# Patient Record
Sex: Male | Born: 1980 | Race: White | Hispanic: No | Marital: Married | Smoking: Never smoker
Health system: Southern US, Community
[De-identification: ages and names within clinical notes are randomized; demographics above are authoritative.]

---

## 2018-04-07 ENCOUNTER — Other Ambulatory Visit: Payer: Self-pay

## 2018-04-07 ENCOUNTER — Emergency Department (HOSPITAL_BASED_OUTPATIENT_CLINIC_OR_DEPARTMENT_OTHER)
Admission: EM | Admit: 2018-04-07 | Discharge: 2018-04-07 | Disposition: A | Discharge status: Home / Self Care | Payer: BLUE CROSS/BLUE SHIELD | Attending: Emergency Medicine | Admitting: Emergency Medicine

## 2018-04-07 ENCOUNTER — Emergency Department (HOSPITAL_BASED_OUTPATIENT_CLINIC_OR_DEPARTMENT_OTHER): Payer: BLUE CROSS/BLUE SHIELD

## 2018-04-07 ENCOUNTER — Encounter (HOSPITAL_BASED_OUTPATIENT_CLINIC_OR_DEPARTMENT_OTHER): Payer: Self-pay | Admitting: Adult Health

## 2018-04-07 DIAGNOSIS — R3912 Poor urinary stream: Secondary | ICD-10-CM | POA: Diagnosis not present

## 2018-04-07 DIAGNOSIS — R51 Headache: Secondary | ICD-10-CM | POA: Diagnosis present

## 2018-04-07 DIAGNOSIS — Z79899 Other long term (current) drug therapy: Secondary | ICD-10-CM | POA: Diagnosis not present

## 2018-04-07 DIAGNOSIS — R200 Anesthesia of skin: Secondary | ICD-10-CM | POA: Insufficient documentation

## 2018-04-07 DIAGNOSIS — R519 Headache, unspecified: Secondary | ICD-10-CM

## 2018-04-07 DIAGNOSIS — R42 Dizziness and giddiness: Secondary | ICD-10-CM | POA: Insufficient documentation

## 2018-04-07 DIAGNOSIS — R202 Paresthesia of skin: Secondary | ICD-10-CM | POA: Insufficient documentation

## 2018-04-07 DIAGNOSIS — H538 Other visual disturbances: Secondary | ICD-10-CM | POA: Diagnosis not present

## 2018-04-07 LAB — ETHANOL: Alcohol, Ethyl (B): <10 mg/dL (ref <10)

## 2018-04-07 LAB — URINALYSIS, ROUTINE W REFLEX MICROSCOPIC
BILIRUBIN URINE: NEGATIVE
Glucose, UA: NEGATIVE mg/dL
Hgb urine dipstick: NEGATIVE
Ketones, ur: NEGATIVE mg/dL
LEUKOCYTES UA: NEGATIVE
NITRITE: NEGATIVE
PH: 6 (ref 5.0–8.0)
Protein, ur: NEGATIVE mg/dL
SPECIFIC GRAVITY, URINE: 1.02 (ref 1.005–1.030)

## 2018-04-07 LAB — COMPREHENSIVE METABOLIC PANEL
ALBUMIN: 4.7 g/dL (ref 3.5–5.0)
ALT: 32 U/L (ref 0–44)
AST: 22 U/L (ref 15–41)
Alkaline Phosphatase: 63 U/L (ref 38–126)
Anion gap: 9 (ref 5–15)
BILIRUBIN TOTAL: 0.5 mg/dL (ref 0.3–1.2)
BUN: 17 mg/dL (ref 6–20)
CALCIUM: 9.1 mg/dL (ref 8.9–10.3)
CO2: 26 mmol/L (ref 22–32)
CREATININE: 1.05 mg/dL (ref 0.61–1.24)
Chloride: 103 mmol/L (ref 98–111)
GFR calc Af Amer: >60 mL/min (ref >60)
Glucose, Bld: 96 mg/dL (ref 70–99)
Potassium: 3.9 mmol/L (ref 3.5–5.1)
Sodium: 138 mmol/L (ref 135–145)
TOTAL PROTEIN: 7.3 g/dL (ref 6.5–8.1)

## 2018-04-07 LAB — CBC
HEMATOCRIT: 39.8 % (ref 39.0–52.0)
Hemoglobin: 14.3 g/dL (ref 13.0–17.0)
MCH: 30.8 pg (ref 26.0–34.0)
MCHC: 35.9 g/dL (ref 30.0–36.0)
MCV: 85.6 fL (ref 78.0–100.0)
Platelets: 191 10*3/uL (ref 150–400)
RBC: 4.65 MIL/uL (ref 4.22–5.81)
RDW: 12.4 % (ref 11.5–15.5)
WBC: 7.2 10*3/uL (ref 4.0–10.5)

## 2018-04-07 LAB — RAPID URINE DRUG SCREEN, HOSP PERFORMED
Amphetamines: NOT DETECTED
Barbiturates: NOT DETECTED
Benzodiazepines: NOT DETECTED
Cocaine: NOT DETECTED
OPIATES: NOT DETECTED
Tetrahydrocannabinol: NOT DETECTED

## 2018-04-07 LAB — DIFFERENTIAL
BASOS ABS: 0 10*3/uL (ref 0.0–0.1)
Basophils Relative: 0 %
Eosinophils Absolute: 0.2 10*3/uL (ref 0.0–0.7)
Eosinophils Relative: 3 %
LYMPHS ABS: 3 10*3/uL (ref 0.7–4.0)
LYMPHS PCT: 42 %
MONOS PCT: 8 %
Monocytes Absolute: 0.6 10*3/uL (ref 0.1–1.0)
NEUTROS PCT: 47 %
Neutro Abs: 3.4 10*3/uL (ref 1.7–7.7)

## 2018-04-07 LAB — APTT: aPTT: 28 seconds (ref 24–36)

## 2018-04-07 LAB — CBG MONITORING, ED: Glucose-Capillary: 100 mg/dL — ABNORMAL HIGH (ref 70–99)

## 2018-04-07 LAB — PROTIME-INR
INR: 0.93
Prothrombin Time: 12.4 seconds (ref 11.4–15.2)

## 2018-04-07 LAB — TROPONIN I

## 2018-04-07 NOTE — ED Triage Notes (Signed)
LKW 17:15, While sitting on phone he was unable to focus his eyes and developed left sided facial tingling and left arm numbness. HE reports that he stood up and felt a little off balance at that time. He is currently alert and oriented and denies pain at this time. HE denies headache. BEFAST is negative. LVO negative.

## 2018-04-07 NOTE — ED Notes (Signed)
Pt. Reports he has a frontal headache  ... No deficits noted in Pt.

## 2018-04-07 NOTE — ED Provider Notes (Signed)
Emergency Department Provider Note   I have reviewed the triage vital signs and the nursing notes.   HISTORY  Chief Complaint Stroke Symptoms   HPI Sean Solis is a 37 y.o. male without significant past medical history the presents the emergency department today secondary to blurry vision and left-sided tingling both of which have resolved.  Patient states that he does not smoke cigarettes nor does he do any drugs.  Drinks alcohol occasionally but has not drank any recently.  Patient states he was at home after a normal day of work when he was looking at his phone and also he could not focus.  This lasted a few seconds and then he noticed he had a weird tingling sensation in the left side of his face and his left arm.  No chest pain, nausea, vomiting, weakness, diaphoresis or lightheadedness.  Never had any this before.  Stated he did not have any focal weakness during this time.  He had no difficulty swallowing or speaking during this time.  Once he started to improve he googled his symptoms and was worried about cardiac and neurologic problems.  He takes medications for allergies.  No recent illnesses.  Somewhat decreased urine output but no diarrhea, vomiting, nausea, cough, fevers or other symptoms. No other associated or modifying symptoms.    History reviewed. No pertinent past medical history.  There are no active problems to display for this patient.   History reviewed. No pertinent surgical history.  Current Outpatient Rx  . Order #: [id number] Class: Historical Med    Allergies Patient has no known allergies.  History reviewed. No pertinent family history.  Social History Social History   Tobacco Use  . Smoking status: Never Smoker  . Smokeless tobacco: Never Used  Substance Use Topics  . Alcohol use: Yes  . Drug use: Never    Review of Systems  All other systems negative except as documented in the HPI. All pertinent positives and negatives as reviewed in  the HPI. ____________________________________________   PHYSICAL EXAM:  VITAL SIGNS: ED Triage Vitals  Enc Vitals Group     BP 04/07/18 1758 131/84     Pulse Rate 04/07/18 1758 72     Resp 04/07/18 1758 18     Temp 04/07/18 1758 98.3 F (36.8 C)     Temp Source 04/07/18 1758 Oral     SpO2 04/07/18 1758 99 %     Weight 04/07/18 1759 194 lb 0.1 oz (88 kg)     Height 04/07/18 1759 5\' 8"  (1.727 m)    Constitutional: Alert and oriented. Well appearing and in no acute distress. Eyes: Conjunctivae are normal. PERRL. EOMI. Head: Atraumatic. Nose: No congestion/rhinnorhea. Mouth/Throat: Mucous membranes are moist.  Oropharynx non-erythematous. Neck: No stridor.  No meningeal signs.   Cardiovascular: Normal rate, regular rhythm. Good peripheral circulation. Grossly normal heart sounds.   Respiratory: Normal respiratory effort.  No retractions. Lungs CTAB. Gastrointestinal: Soft and nontender. No distention.  Musculoskeletal: No lower extremity tenderness nor edema. No gross deformities of extremities. Neurologic:  Normal speech and language. No gross focal neurologic deficits are appreciated. No altered mental status, able to give full seemingly accurate history.  Face is symmetric, EOM's intact, pupils equal and reactive, vision intact, tongue and uvula midline without deviation. Upper and Lower extremity motor 5/5, intact pain perception in distal extremities, 2+ reflexes in biceps, patella and achilles tendons. Able to perform finger to nose normal with both hands. Walks without assistance or evident ataxia.  Skin:  Skin is warm, dry and intact. No rash noted.   ____________________________________________   LABS (all labs ordered are listed, but only abnormal results are displayed)  Labs Reviewed  CBG MONITORING, ED - Abnormal; Notable for the following components:      Result Value   Glucose-Capillary 100 (*)    All other components within normal limits  ETHANOL    PROTIME-INR  APTT  CBC  DIFFERENTIAL  COMPREHENSIVE METABOLIC PANEL  TROPONIN I  RAPID URINE DRUG SCREEN, HOSP PERFORMED  URINALYSIS, ROUTINE W REFLEX MICROSCOPIC   ____________________________________________  EKG   EKG Interpretation  Date/Time:  Tuesday April 07 2018 17:55:35 EDT Ventricular Rate:  70 PR Interval:    QRS Duration: 95 QT Interval:  377 QTC Calculation: 407 R Axis:   66 Text Interpretation:  Sinus rhythm No old tracing to compare Confirmed by Marily Memos [phone]) on 04/07/2018 6:34:09 PM Also confirmed by Marily Memos [phone]), editor Barbette Hair [phone])  on 04/08/2018 7:03:16 AM       ____________________________________________  RADIOLOGY  No results found.  ____________________________________________   PROCEDURES  Procedure(s) performed:   Procedures   ____________________________________________   INITIAL IMPRESSION / ASSESSMENT AND PLAN / ED COURSE  Very atypical symptoms for a stroke in either way symptoms are improving with just minimal tingling around the temporal area remains so code stroke not called.  TIA at worst.  No other symptoms to suggest possible ACS I think a troponin and EKG will be sufficient.  I do not have a good idea of what did happen to cause bilateral blurry vision and left facial and arm paresthesias so we will likely discuss with neurology if everything is normal.  Patient with continued improvement of symptoms. Workup negative. No neurologic deficits. Low suspicion for TIA at this time. Will fu w/ pcp for same.    Pertinent labs & imaging results that were available during my care of the patient were reviewed by me and considered in my medical decision making (see chart for details).  ____________________________________________  FINAL CLINICAL IMPRESSION(S) / ED DIAGNOSES  Final diagnoses:  Acute nonintractable headache, unspecified headache type  Dizzinesses     MEDICATIONS GIVEN DURING THIS  VISIT:  Medications - No data to display   NEW OUTPATIENT MEDICATIONS STARTED DURING THIS VISIT:  Discharge Medication List as of 04/07/2018  8:47 PM      Note:  This note was prepared with assistance of Dragon voice recognition software. Occasional wrong-word or sound-a-like substitutions may have occurred due to the inherent limitations of voice recognition software.   Lorel Lembo, Barbara Cower, MD 04/09/18 0001

## 2018-04-07 NOTE — ED Notes (Signed)
Family at bedside. 

## 2018-04-07 NOTE — ED Notes (Signed)
ED Provider at bedside. 

## 2019-05-31 IMAGING — CT CT HEAD W/O CM
3 series · 15 of 47 positions shown, 18 images · non-contrast
Comparison: None.

CLINICAL DATA: Blurred vision. Left-sided tingling and numbness.
Left-sided headache.

EXAM:
CT HEAD WITHOUT CONTRAST
TECHNIQUE: Contiguous axial images were obtained from the base of the skull
through the vertex without intravenous contrast.

[Series 2: head wo · axial · 0.42mm/px · z∈[+302,+438]mm · 9 of 33 slices shown, 12 images]
[im 3/33  brain]
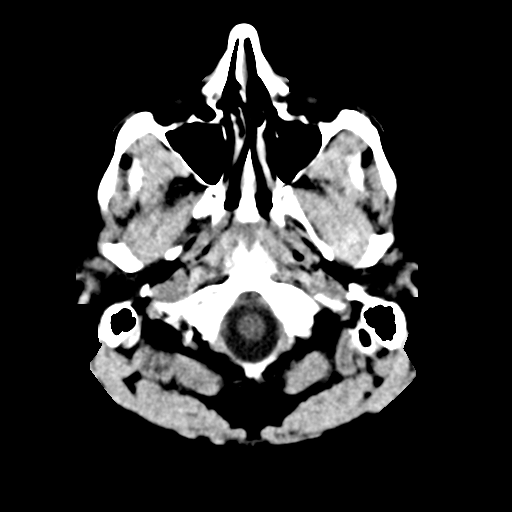
[im 3/33  bone]
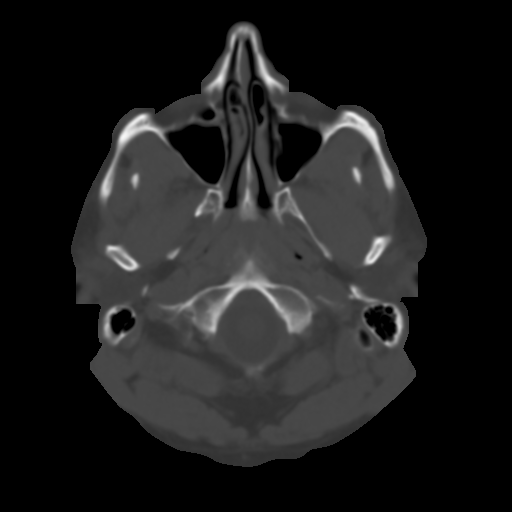
[im 6/33  brain]
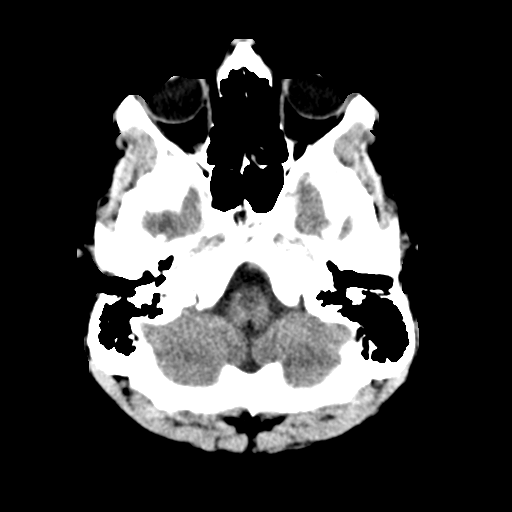
[im 9/33  brain]
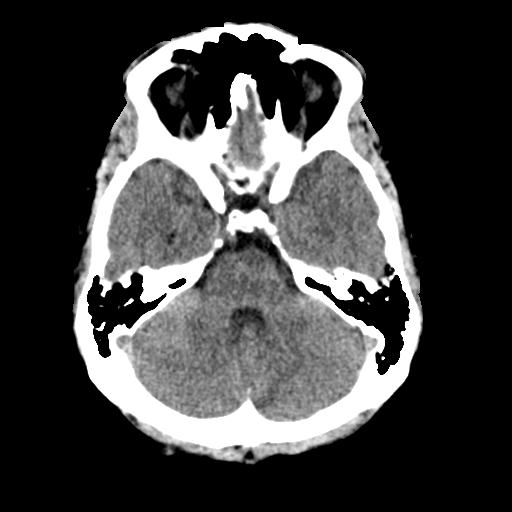
[im 13/33  brain]
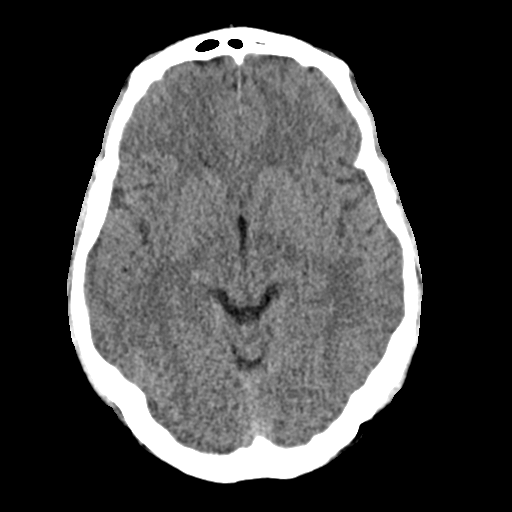
[im 17/33  brain]
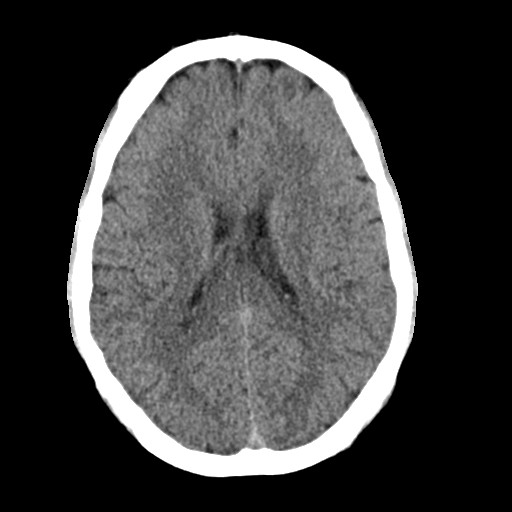
[im 17/33  bone]
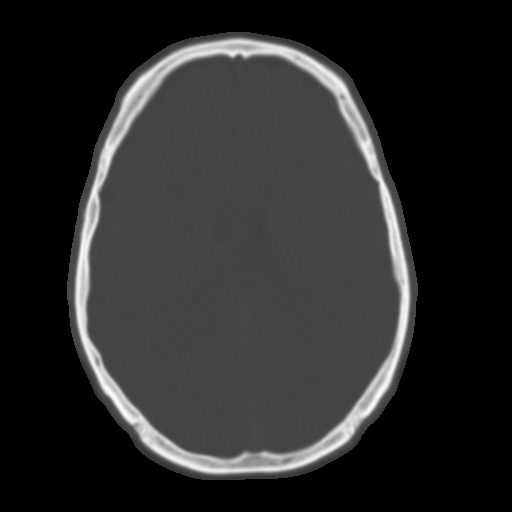
[im 20/33  brain]
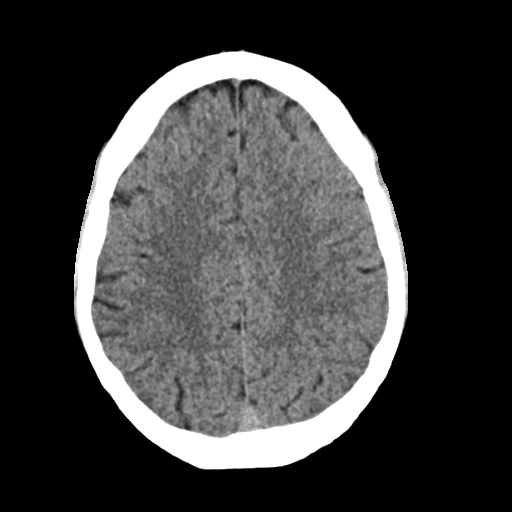
[im 24/33  brain]
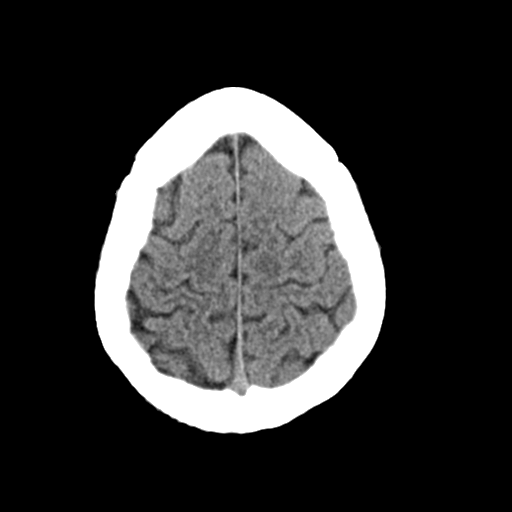
[im 27/33  brain]
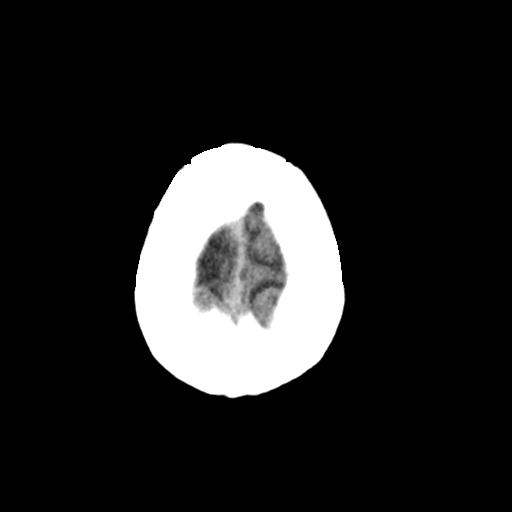
[im 30/33  brain]
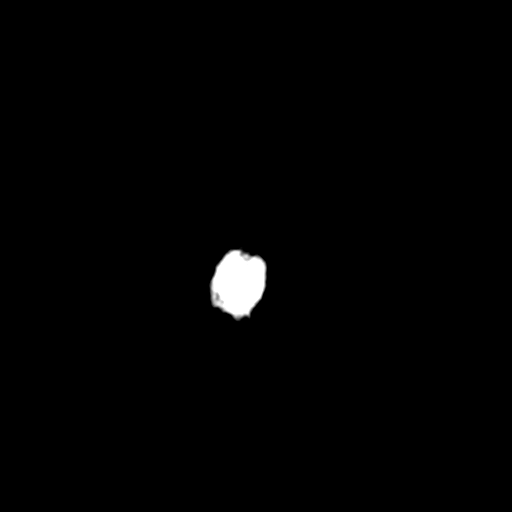
[im 30/33  bone]
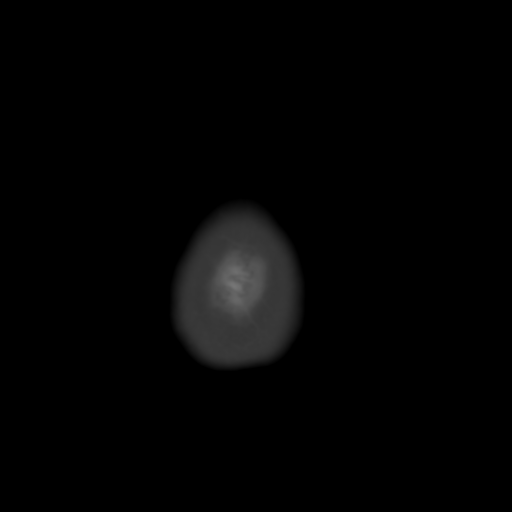

[Series 4: coronal soft · coronal · 0.31mm/px · 3 of 68 slices shown]
[im 23/68  brain]
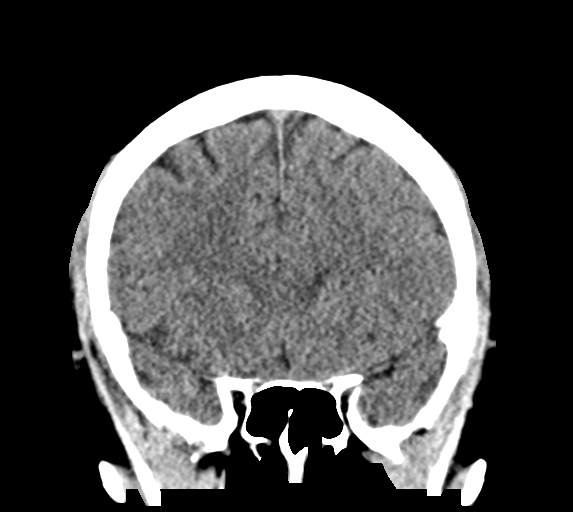
[im 30/68  brain]
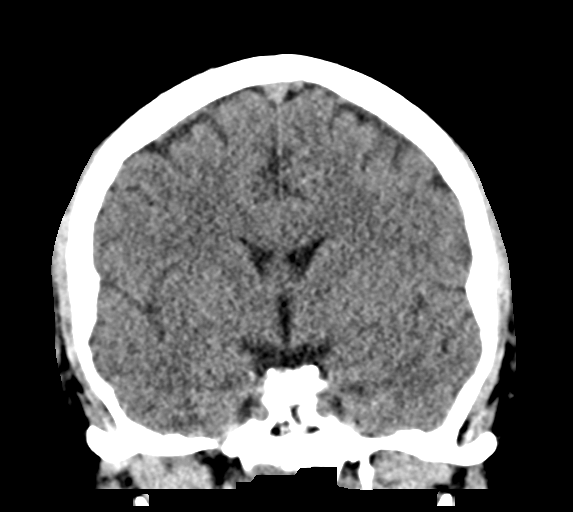
[im 38/68  brain]
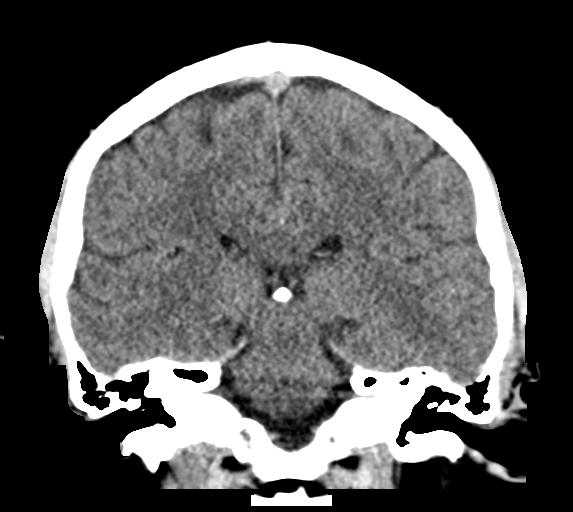

[Series 5: sag soft · sagittal · 0.31mm/px · 3 of 60 slices shown]
[im 20/60  brain]
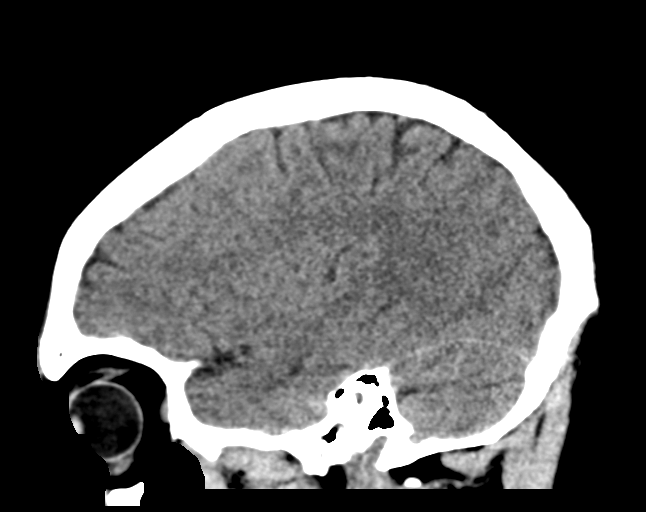
[im 30/60  brain]
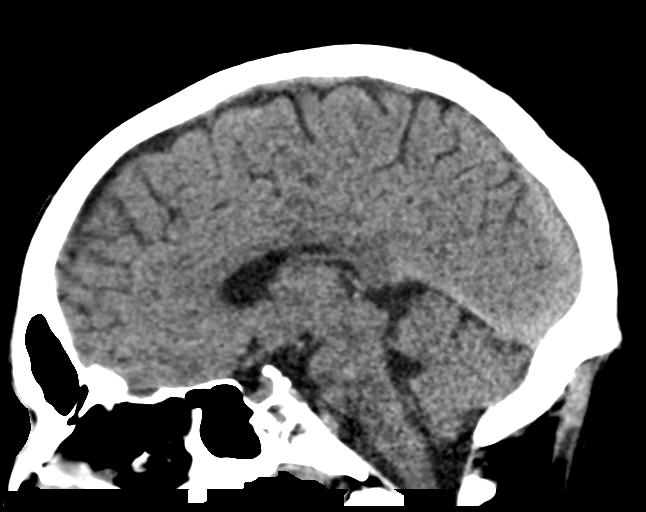
[im 40/60  brain]
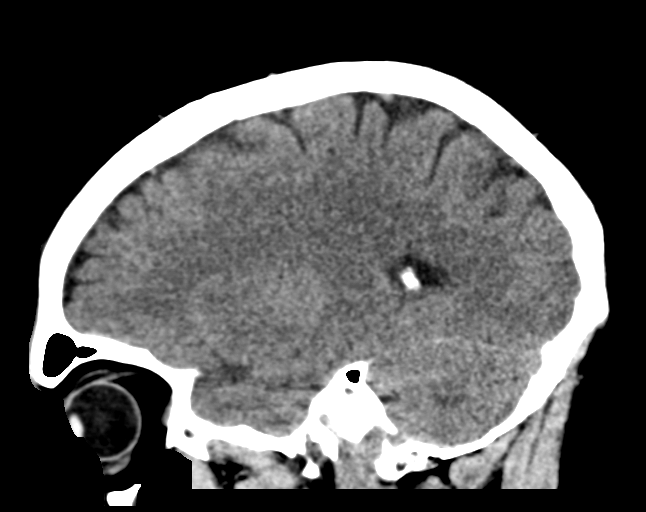

[15 of 47 positions shown; findings below may reference images not displayed]

FINDINGS: Brain: The brain shows a normal appearance without evidence of
malformation, atrophy, old or acute small or large vessel
infarction, mass lesion, hemorrhage, hydrocephalus or extra-axial
collection.

Vascular: No hyperdense vessel. No evidence of atherosclerotic
calcification.

Skull: Normal.  No traumatic finding.  No focal bone lesion.

Sinuses/Orbits: Sinuses are clear. Orbits appear normal. Mastoids
are clear.

Other: None significant
IMPRESSION: Normal head CT
